# Patient Record
Sex: Male | Born: 1995 | Race: White | Hispanic: No | Marital: Single | Smoking: Never smoker
Health system: Southern US, Community
[De-identification: ages and names within clinical notes are randomized; demographics above are authoritative.]

## PROBLEM LIST (undated history)

## (undated) HISTORY — PX: OTHER SURGICAL HISTORY: SHX169

---
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
## 2015-06-18 IMAGING — CR DG HAND COMPLETE 3+V*L*
3 series · 3 of 3 positions shown · non-contrast
Comparison: None.

CLINICAL DATA: Fall.  Lacerations of the hand.

EXAM:
LEFT HAND - COMPLETE 3+ VIEW

[x hand pa left]
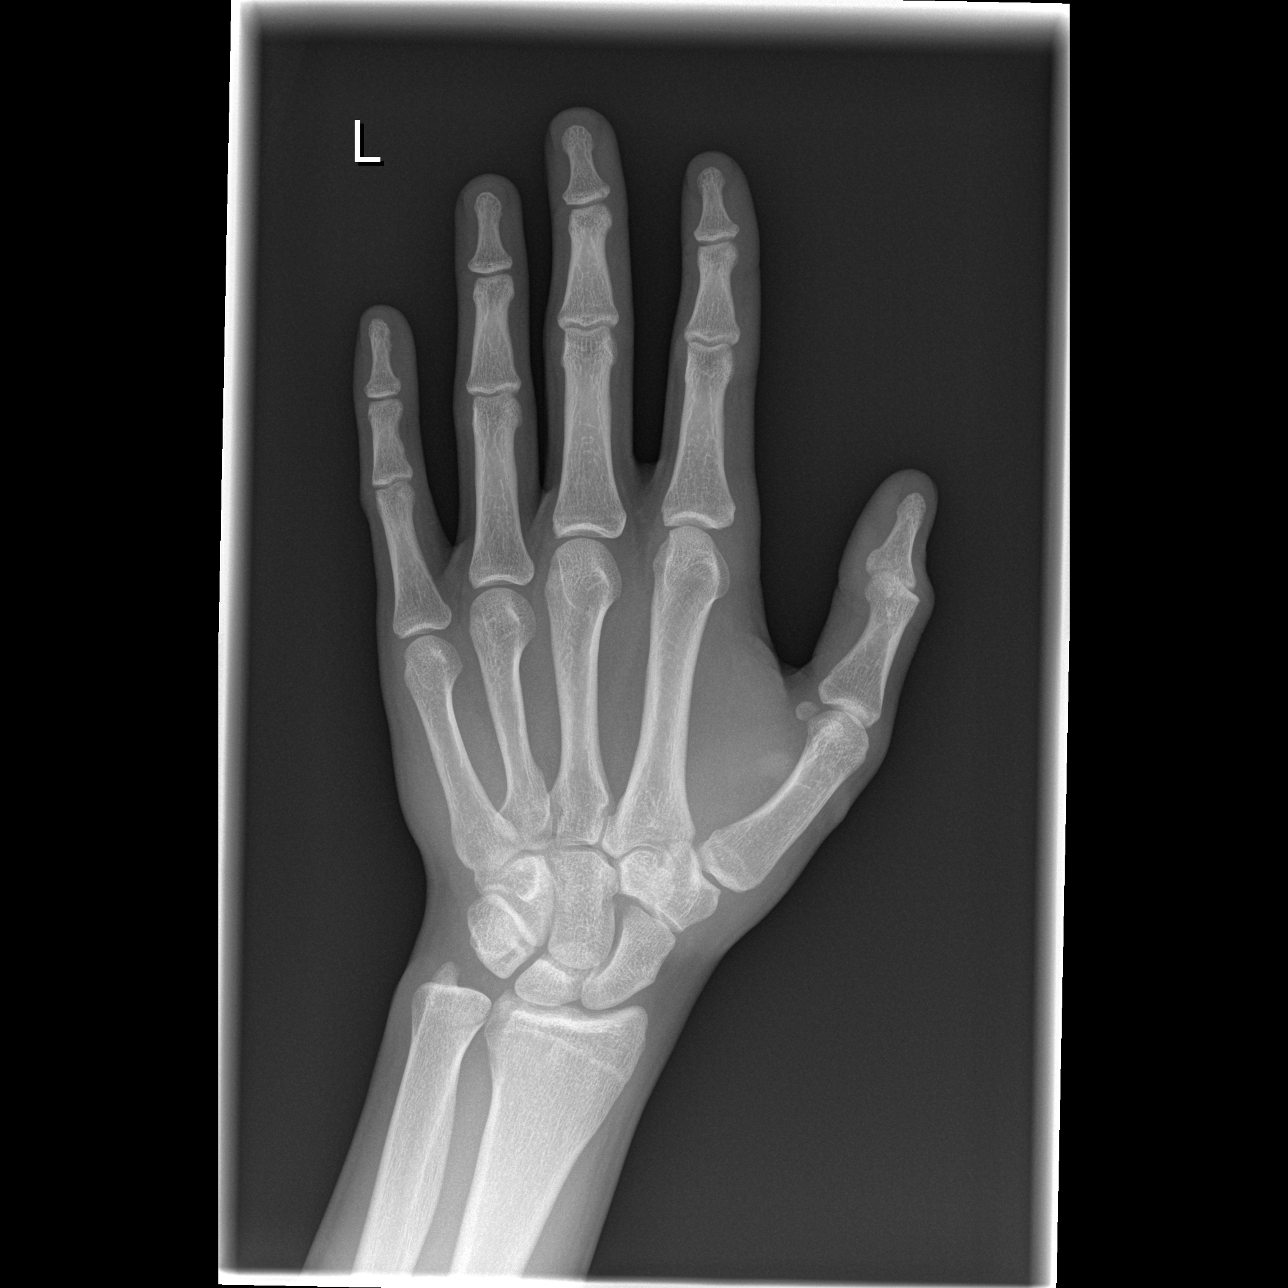

[x hand oblique left]
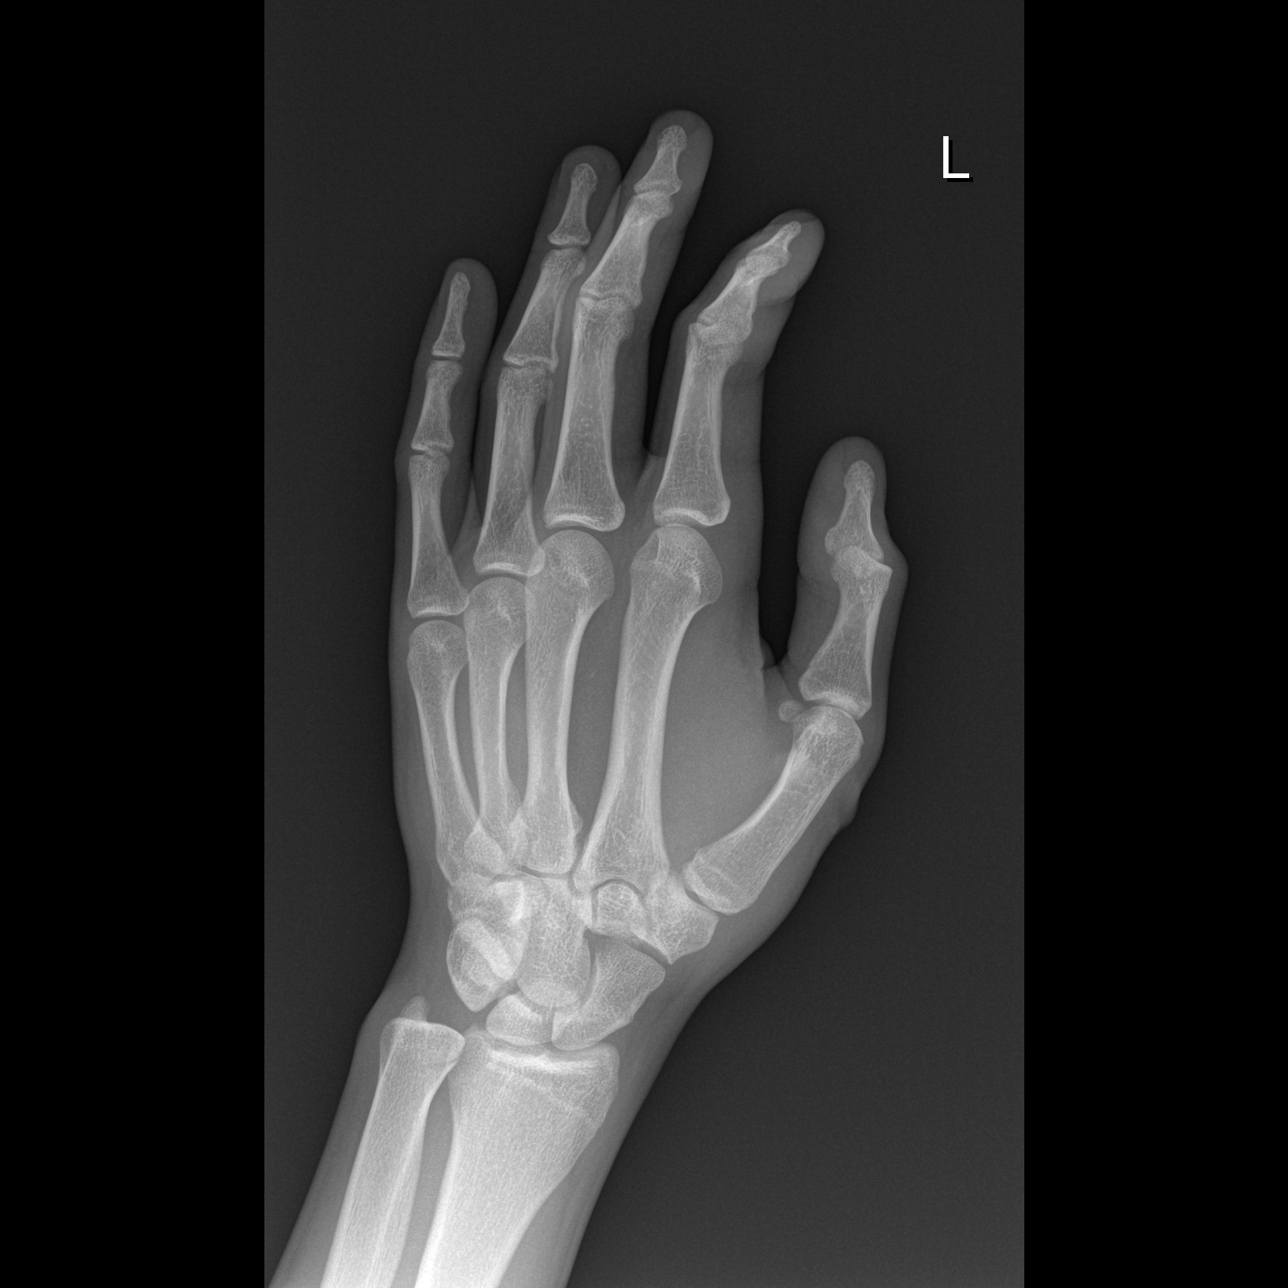

[x hand lat left]
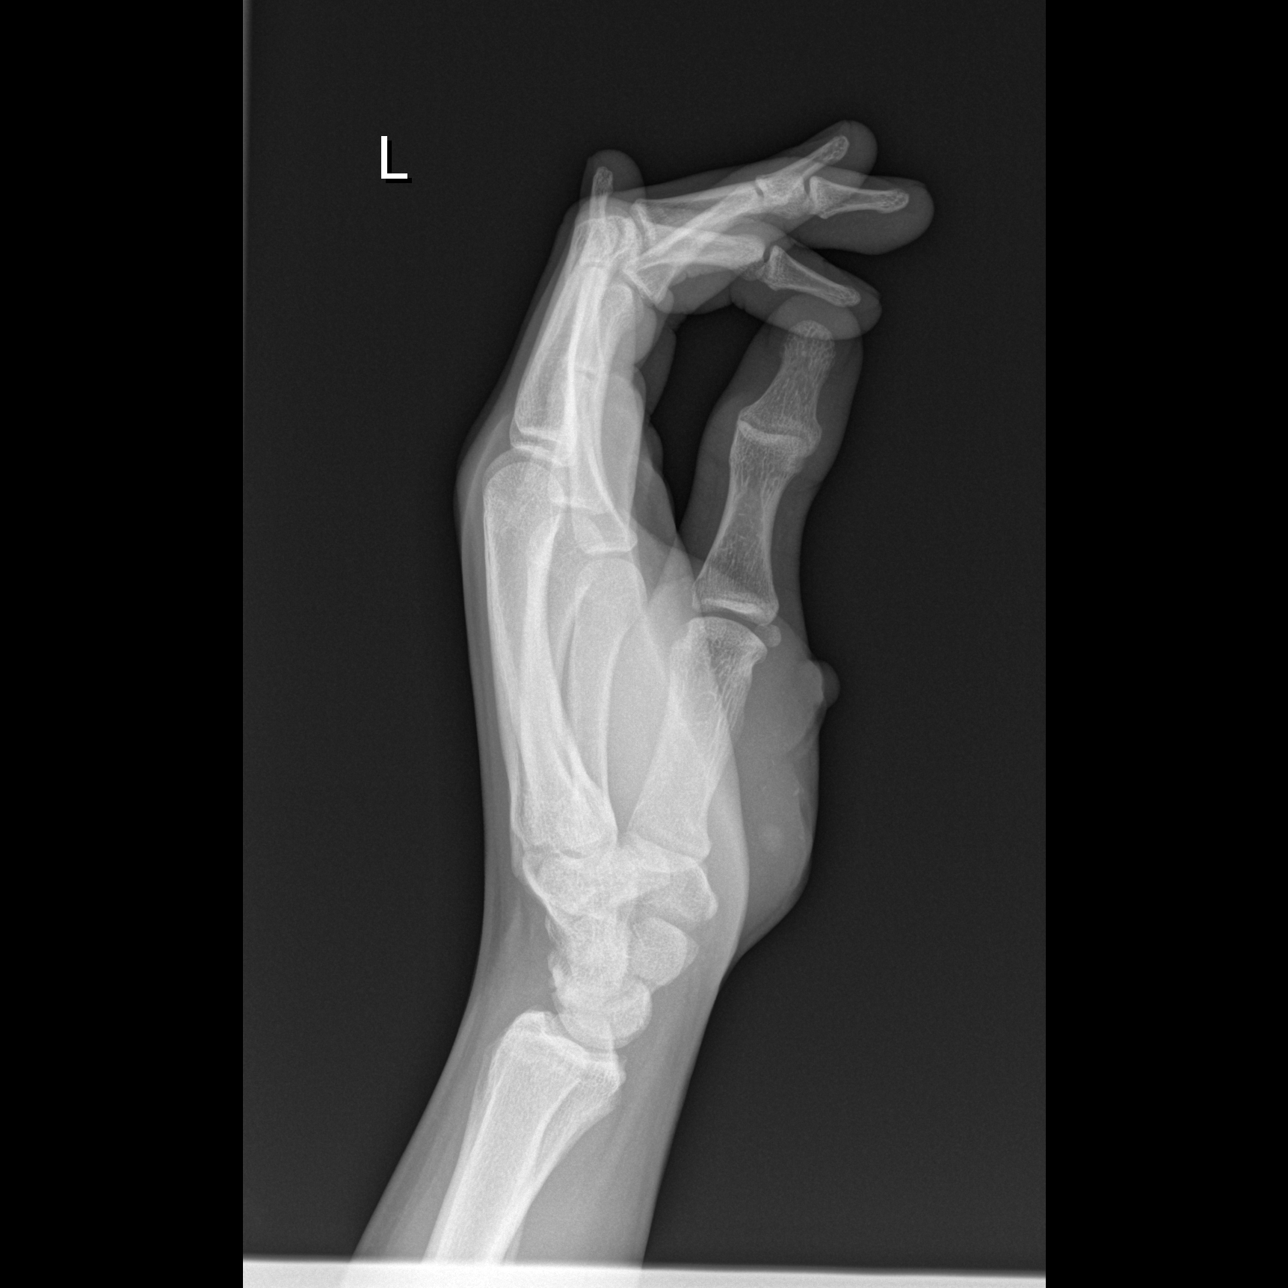

[3 of 3 positions shown; findings below may reference images not displayed]

FINDINGS: There is no evidence of fracture or dislocation. There is no
evidence of arthropathy or other focal bone abnormality. Soft tissue
defect consistent with laceration at the base of the left first
finger. No radiopaque soft tissue foreign bodies.
IMPRESSION: Negative.
